# Patient Record
Sex: Female | Born: 2005 | Race: Black or African American | Hispanic: No | Marital: Single | Smoking: Never smoker
Health system: Southern US, Community
[De-identification: ages and names within clinical notes are randomized; demographics above are authoritative.]

## PROBLEM LIST (undated history)

## (undated) ENCOUNTER — Inpatient Hospital Stay (HOSPITAL_COMMUNITY): Payer: Self-pay

## (undated) DIAGNOSIS — Z789 Other specified health status: Secondary | ICD-10-CM

---
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
## 2023-08-18 ENCOUNTER — Encounter: Payer: Self-pay | Admitting: *Deleted

## 2023-08-24 NOTE — Progress Notes (Deleted)
    SUBJECTIVE:   CHIEF COMPLAINT / HPI: Testing  Discussed the use of AI scribe software for clinical note transcription with the patient, who gave verbal consent to proceed.  History of Present Illness    PERTINENT  PMH / PSH: ***  OBJECTIVE:   There were no vitals taken for this visit.  ***  ASSESSMENT/PLAN:   Assessment & Plan    Assessment and Plan Assessment & Plan    Genora Kidd, MD [facility]

## 2023-08-25 ENCOUNTER — Ambulatory Visit: Admitting: Student

## 2023-08-26 ENCOUNTER — Ambulatory Visit: Admitting: Student

## 2023-10-16 ENCOUNTER — Ambulatory Visit (INDEPENDENT_AMBULATORY_CARE_PROVIDER_SITE_OTHER): Admitting: Family Medicine

## 2023-10-16 ENCOUNTER — Other Ambulatory Visit (HOSPITAL_COMMUNITY)
Admission: RE | Admit: 2023-10-16 | Discharge: 2023-10-16 | Disposition: A | Discharge status: Home / Self Care | Source: Ambulatory Visit | Attending: Family Medicine | Admitting: Family Medicine

## 2023-10-16 VITALS — BP 121/73 | HR 76 | Ht 72.0 in | Wt 276.8 lb

## 2023-10-16 DIAGNOSIS — Z3200 Encounter for pregnancy test, result unknown: Secondary | ICD-10-CM | POA: Diagnosis not present

## 2023-10-16 DIAGNOSIS — N760 Acute vaginitis: Secondary | ICD-10-CM

## 2023-10-16 DIAGNOSIS — Z113 Encounter for screening for infections with a predominantly sexual mode of transmission: Secondary | ICD-10-CM

## 2023-10-16 DIAGNOSIS — Z3201 Encounter for pregnancy test, result positive: Secondary | ICD-10-CM

## 2023-10-16 DIAGNOSIS — N898 Other specified noninflammatory disorders of vagina: Secondary | ICD-10-CM

## 2023-10-16 DIAGNOSIS — B9689 Other specified bacterial agents as the cause of diseases classified elsewhere: Secondary | ICD-10-CM | POA: Diagnosis not present

## 2023-10-16 LAB — POCT WET PREP (WET MOUNT)
Clue Cells Wet Prep Whiff POC: POSITIVE
Trichomonas Wet Prep HPF POC: ABSENT

## 2023-10-16 LAB — POCT URINE PREGNANCY: Preg Test, Ur: POSITIVE — AB

## 2023-10-16 MED ORDER — METRONIDAZOLE 500 MG PO TABS: 500.0000 mg | ORAL_TABLET | Freq: Two times a day (BID) | ORAL | 0 refills | Status: DC

## 2023-10-16 NOTE — Assessment & Plan Note (Addendum)
 History of BV Wet mount, STD testing done today.  Patient declined HIV and RPR Discussed contraception, patient prefers to continue using barrier contraception at this time Pregnancy test POSITIVE Wet mount showed BV, Rx Flagyl  twice daily x 7 days Will f/u STD testing as appropriate

## 2023-10-16 NOTE — Addendum Note (Signed)
 Addended byBETHA COWARD, Coston Mandato on: 10/16/2023 11:49 AM   Modules accepted: Orders

## 2023-10-16 NOTE — Addendum Note (Signed)
 Addended by: Jessica Checketts L on: 10/16/2023 11:50 AM   Modules accepted: Orders

## 2023-10-16 NOTE — Progress Notes (Signed)
    SUBJECTIVE:   CHIEF COMPLAINT / HPI:   Here for STD and pregnancy testing Has been having some vaginal discharge over the past 5 days.  Describes it as milky white and smelly Denies any dysuria, abdominal pain, fevers, flank pain, vaginal bleeding Sexually active with 1 female partner, unsure if he has history of STDs She uses condoms for protection and is not interested in other forms of contraception at this time  LMP 7/1-7/4 Denies bleeding or spotting since her period  PERTINENT  PMH / PSH: History of BV  OBJECTIVE:   BP 121/73   Pulse 76   Ht 6' (1.829 m)   Wt 276 lb 12.8 oz (125.6 kg)   LMP 09/08/2023   SpO2 100%   BMI 37.54 kg/m    General: NAD, pleasant, able to participate in exam Respiratory: No respiratory distress Abd: Soft NTND Skin: warm and dry, no rashes noted Psych: Normal affect and mood  Pelvic exam: VULVA: normal appearing vulva with no masses, tenderness or lesions, VAGINA: vaginal discharge - white and creamy, CERVIX: normal appearing cervix without discharge or lesions, no discharge noted. Chaperoned by Thersia CMA   ASSESSMENT/PLAN:   Assessment & Plan BV (bacterial vaginosis) Vaginal discharge Routine screening for STI (sexually transmitted infection) Encounter for pregnancy test, result unknown History of BV Wet mount, STD testing done today.  Patient declined HIV and RPR Discussed contraception, patient prefers to continue using barrier contraception at this time Pregnancy test POSITIVE Wet mount showed BV, Rx Flagyl  twice daily x 7 days Will f/u STD testing as appropriate Positive pregnancy test Obtain initial OB screening labs today + hgb cascade [redacted]w[redacted]d per LMP Set up for initial OB visit Advised to start prenatal vitamin Pt feels excited about this result and wants to continue with the pregnancy   Payton Coward, MD [facility]

## 2023-10-16 NOTE — Patient Instructions (Addendum)
 Your pregnancy test was positive We are collecting some bloodwork today. We are scheduling you for your initial OB visit. Please start taking a prenatal vitamin (over the counter)  You are positive for BV.  Please take Flagyl  twice daily for 7 days  I will let give you a call if any of your results from today are abnormal or require treatment.  Otherwise I will send a message on MyChart

## 2023-10-19 LAB — CERVICOVAGINAL ANCILLARY ONLY
Chlamydia: POSITIVE — AB
Comment: NEGATIVE
Comment: NEGATIVE
Comment: NORMAL
Neisseria Gonorrhea: NEGATIVE
Trichomonas: NEGATIVE

## 2023-10-21 ENCOUNTER — Ambulatory Visit: Payer: Self-pay | Admitting: Family Medicine

## 2023-10-21 ENCOUNTER — Telehealth: Payer: Self-pay

## 2023-10-21 DIAGNOSIS — A749 Chlamydial infection, unspecified: Secondary | ICD-10-CM

## 2023-10-21 LAB — CBC/D/PLT+RPR+RH+ABO+RUBIGG...
Antibody Screen: NEGATIVE
Basophils Absolute: 0 x10E3/uL (ref 0.0–0.2)
Basos: 0 %
Bilirubin, UA: NEGATIVE
EOS (ABSOLUTE): 0 x10E3/uL (ref 0.0–0.4)
Eos: 0 %
Glucose, UA: NEGATIVE
HCV Ab: NONREACTIVE
HIV Screen 4th Generation wRfx: NONREACTIVE
Hematocrit: 36.5 % (ref 34.0–46.6)
Hemoglobin: 11.9 g/dL (ref 11.1–15.9)
Hepatitis B Surface Ag: NEGATIVE
Immature Grans (Abs): 0 x10E3/uL (ref 0.0–0.1)
Immature Granulocytes: 0 %
Ketones, UA: NEGATIVE
Lymphocytes Absolute: 2.4 x10E3/uL (ref 0.7–3.1)
Lymphs: 20 %
MCH: 29.2 pg (ref 26.6–33.0)
MCHC: 32.6 g/dL (ref 31.5–35.7)
MCV: 90 fL (ref 79–97)
Monocytes Absolute: 0.6 x10E3/uL (ref 0.1–0.9)
Monocytes: 5 %
Neutrophils Absolute: 8.6 x10E3/uL — ABNORMAL HIGH (ref 1.4–7.0)
Neutrophils: 75 %
Nitrite, UA: NEGATIVE
Platelets: 334 x10E3/uL (ref 150–450)
Protein,UA: NEGATIVE
RBC, UA: NEGATIVE
RBC: 4.08 x10E6/uL (ref 3.77–5.28)
RDW: 14.9 % (ref 11.7–15.4)
RPR Ser Ql: NONREACTIVE
Rh Factor: POSITIVE
Rubella Antibodies, IGG: 8.8 {index} (ref >0.99)
Specific Gravity, UA: 1.023 (ref 1.005–1.030)
Urobilinogen, Ur: 0.2 mg/dL (ref 0.2–1.0)
WBC: 11.6 x10E3/uL — ABNORMAL HIGH (ref 3.4–10.8)
pH, UA: 6 (ref 5.0–7.5)

## 2023-10-21 LAB — HCV INTERPRETATION

## 2023-10-21 LAB — MICROSCOPIC EXAMINATION
Bacteria, UA: NONE SEEN
Casts: NONE SEEN /LPF
RBC, Urine: NONE SEEN /HPF (ref 0–2)

## 2023-10-21 LAB — URINE CULTURE, OB REFLEX

## 2023-10-21 LAB — HGB FRACTIONATION CASCADE
Hgb A2: 2.6 (ref 1.8–3.2)
Hgb A: 97.4 (ref 96.4–98.8)
Hgb F: 0 % (ref 0.0–2.0)
Hgb S: 0 %

## 2023-10-21 MED ORDER — AZITHROMYCIN 500 MG PO TABS: 1000.0000 mg | ORAL_TABLET | Freq: Once | ORAL | 0 refills | Status: AC

## 2023-10-21 NOTE — Telephone Encounter (Signed)
 STI form place into to be faxed box. To be faxed off. Nelson Land, CMA

## 2023-10-21 NOTE — Telephone Encounter (Signed)
-----   Message from [facility] Lamar B sent at 10/20/2023 10:51 AM EDT ----- Regarding: STI reporting Pos chlamydia

## 2023-10-22 ENCOUNTER — Other Ambulatory Visit: Payer: Self-pay

## 2023-10-22 ENCOUNTER — Ambulatory Visit (INDEPENDENT_AMBULATORY_CARE_PROVIDER_SITE_OTHER)

## 2023-10-22 VITALS — BP 114/70 | HR 73 | Wt 274.0 lb

## 2023-10-22 DIAGNOSIS — O219 Vomiting of pregnancy, unspecified: Secondary | ICD-10-CM

## 2023-10-22 DIAGNOSIS — A749 Chlamydial infection, unspecified: Secondary | ICD-10-CM | POA: Diagnosis not present

## 2023-10-22 DIAGNOSIS — Z3401 Encounter for supervision of normal first pregnancy, first trimester: Secondary | ICD-10-CM | POA: Diagnosis not present

## 2023-10-22 MED ORDER — DOXYLAMINE-PYRIDOXINE 10-10 MG PO TBEC: 4.0000 | DELAYED_RELEASE_TABLET | Freq: Four times a day (QID) | ORAL | 2 refills | Status: DC | PRN

## 2023-10-22 MED ORDER — AZITHROMYCIN 500 MG PO TABS: 1000.0000 mg | ORAL_TABLET | Freq: Once | ORAL | Status: DC

## 2023-10-22 MED ORDER — DOXYLAMINE-PYRIDOXINE 10-10 MG PO TBEC: 1.0000 | DELAYED_RELEASE_TABLET | Freq: Four times a day (QID) | ORAL | 2 refills | Status: AC | PRN

## 2023-10-22 NOTE — Patient Instructions (Signed)
 Pregnancy Related Return Precautions The follow are signs/symptoms that are abnormal in pregnancy and may require further evaluation at the MAU in the [facility]: You have cramping/contractions that do not go away with drinking water, especially if they are lasting 30 seconds to 1.5 minutes, coming and going every 5-10 minutes for an hour or more, or are getting stronger and you cannot walk or talk while having a contraction/cramp. Your water breaks.  Sometimes it is a big gush of fluid, sometimes it is just a trickle that keeps getting your underwear wet or running down your legs You have vaginal bleeding.    You are beyond 22 weeks and do not feel your baby moving like normal.  If you do not, get something to eat and drink (something cold or something with sugar like peanut butter or juice) and lay down and focus on feeling your baby move. If your baby is still not moving like normal, you should go to MAU. You should feel your baby move 6 times in one hour, or 10 times in two hours. You have a persistent headache that does not go away with 1000mg  of Tylenol , is accompanied by vision changes, chest pain, difficulty breathing, severe pain in your right upper abdomen, or worsening leg swelling- these can all be signs of high blood pressure in pregnancy and need to be evaluated by a provider immediately  For any pregnancy-related emergencies, please go to the Maternity Admissions Unit in the [facility] at [facility]. You will use hospital Entrance C.

## 2023-10-22 NOTE — Progress Notes (Signed)
 Patient Name: Dawn Bell Date of Birth: January 31, 2006 [facility] Initial Prenatal Visit  Dawn Bell is a 18 y.o. year old G1P0 at [redacted]w[redacted]d who presents for her initial prenatal visit. Pregnancy is not planned She reports morning sickness and nausea. Nauseous 3-4x/week, no vomiting. She is taking a prenatal vitamin.  She denies pelvic pain or vaginal bleeding.   Pregnancy Dating: The patient is dated by LMP.  LMP: 09/08/2023 Period is certain:  Yes.  Periods were regular:  Yes.  LMP was a typical period:  Yes.  Using hormonal contraception in 3 months prior to conception: No  Lab Review: Blood type: O Rh Status: + Antibody screen: Not done HIV: Negative RPR: Negative Hemoglobin electrophoresis reviewed: Yes Results of OB urine culture are: Negative Rubella: Immune Hep C Ab: Negative Varicella status is Immune  PMH: Reviewed and as detailed below: HTN: No  Gestational Hypertension/preeclampsia: No  Type 1 or 2 Diabetes: No  Depression:  Yes, not medicated or diagnosed but resolved about 1 year ago Seizure disorder:  No VTE: No ,  History of STI Yes, chlamydia will do test for cure in 4 weeks. Abnormal Pap smear:  No, Genital herpes simplex:  No   PSH: Gynecologic Surgery:  no Surgical history reviewed, notable for: none  Obstetric History: Obstetric history tab updated and reviewed.  Summary of prior pregnancies: none Cesarean delivery: No  Gestational Diabetes:  No Hypertension in pregnancy: No History of preterm birth: No History of LGA/SGA infant:  No History of shoulder dystocia: No Indications for referral were reviewed, and the patient has no obstetric indications for referral to High Risk OB Clinic at this time.   Social History: Partner's name: Jayden murl)  Tobacco use: No Alcohol use:  No Other substance use:  No  Financial Assistance:  Adopt A Mom Patient?: No  If AAM, have they contacted Eric to enroll in [employer]?: No  Current Medications:  PNV  Reviewed and appropriate in pregnancy.   Genetic and Infection Screen: Flow Sheet Updated Yes  Prenatal Exam: Gen: Well nourished, well developed.  No distress.  Vitals noted. HEENT: Normocephalic, atraumatic.  Neck supple without cervical lymphadenopathy, thyromegaly or thyroid nodules.  Fair dentition. CV: RRR no murmur, gallops or rubs Lungs: CTA B.  Normal respiratory effort without wheezes or rales. Abd: soft, NTND. +BS.  Uterus not appreciated above pelvis. GU: Normal external female genitalia without lesions.  Nl vaginal, well rugated without lesions. No vaginal discharge.  Bimanual exam: No adnexal mass or TTP. No CMT.  Uterus size normal Ext: No clubbing, cyanosis or edema. Psych: Normal grooming and dress.  Not depressed or anxious appearing.  Normal thought content and process without flight of ideas or looseness of associations  Fetal heart tones: too early*  Assessment/Plan:  Dawn Bell is a 18 y.o. G1P0 at [redacted]w[redacted]d who presents to initiate prenatal care. She is doing well.  Current pregnancy issues include feeling hot and some nausea.  Routine prenatal care: As dating is reliable, a dating ultrasound has not been ordered. Dating tab updated. Pre-pregnancy weight updated. Expected weight gain this pregnancy is 11-20 pounds  Prenatal labs reviewed, notable for chlamydia. Indications for referral to [facility] were reviewed and the patient does not meet criteria for referral.  Medication list reviewed and updated.  Recommended patient see a dentist for regular care.  Bleeding and pain precautions reviewed. Importance of prenatal vitamins reviewed.  Genetic screening offered. Patient opted for: patient undecided,  will address at future visit. The patient has the following indications for aspirinto begin 81 mg at 12-16 weeks: One high risk condition: no single high risk condition  MORE than one moderate risk condition: nulliparity,  obesity, and identifies as African American  Aspirin was  recommended today based upon above risk factors (one high risk condition or more than one moderate risk factor)  The patient will not be age 57 or over at time of delivery. Referral to genetic counseling was offered today.  The patient has the following risk factors for preexisting diabetes: BMI >25 and history of prediabetes . An early 1 hour glucose tolerance test was ordered for next visit because patient had to leave. Pregnancy Medical Home and PHQ-9 forms completed, problems noted: Yes  2. Pregnancy issues include the following which were addressed today:  Nausea, no vomiting yet. Prescribed diclegis  if worsens. Feeling hot, likely related to hormones. Encouraged to use cool rags and fans to help. Strict precautions given for going to the MAU.   Follow up 4 weeks for next prenatal visit.

## 2023-10-23 DIAGNOSIS — Z3401 Encounter for supervision of normal first pregnancy, first trimester: Secondary | ICD-10-CM | POA: Insufficient documentation

## 2023-10-30 ENCOUNTER — Encounter

## 2023-10-30 ENCOUNTER — Encounter: Payer: Self-pay | Admitting: Family Medicine

## 2023-10-30 DIAGNOSIS — A568 Sexually transmitted chlamydial infection of other sites: Secondary | ICD-10-CM | POA: Insufficient documentation

## 2023-11-04 ENCOUNTER — Inpatient Hospital Stay (HOSPITAL_COMMUNITY)
Admission: AD | Admit: 2023-11-04 | Discharge: 2023-11-04 | Disposition: A | Discharge status: Home / Self Care | Attending: Obstetrics and Gynecology | Admitting: Obstetrics and Gynecology

## 2023-11-04 ENCOUNTER — Other Ambulatory Visit: Payer: Self-pay

## 2023-11-04 ENCOUNTER — Inpatient Hospital Stay (HOSPITAL_COMMUNITY)

## 2023-11-04 DIAGNOSIS — R109 Unspecified abdominal pain: Secondary | ICD-10-CM | POA: Diagnosis not present

## 2023-11-04 DIAGNOSIS — O418X10 Other specified disorders of amniotic fluid and membranes, first trimester, not applicable or unspecified: Secondary | ICD-10-CM | POA: Diagnosis not present

## 2023-11-04 DIAGNOSIS — O26891 Other specified pregnancy related conditions, first trimester: Secondary | ICD-10-CM | POA: Diagnosis not present

## 2023-11-04 DIAGNOSIS — O208 Other hemorrhage in early pregnancy: Secondary | ICD-10-CM | POA: Insufficient documentation

## 2023-11-04 DIAGNOSIS — Z3A01 Less than 8 weeks gestation of pregnancy: Secondary | ICD-10-CM | POA: Diagnosis not present

## 2023-11-04 DIAGNOSIS — B9689 Other specified bacterial agents as the cause of diseases classified elsewhere: Secondary | ICD-10-CM | POA: Diagnosis not present

## 2023-11-04 DIAGNOSIS — O23591 Infection of other part of genital tract in pregnancy, first trimester: Secondary | ICD-10-CM | POA: Diagnosis not present

## 2023-11-04 DIAGNOSIS — O468X1 Other antepartum hemorrhage, first trimester: Secondary | ICD-10-CM

## 2023-11-04 DIAGNOSIS — N760 Acute vaginitis: Secondary | ICD-10-CM | POA: Diagnosis not present

## 2023-11-04 DIAGNOSIS — Z3491 Encounter for supervision of normal pregnancy, unspecified, first trimester: Secondary | ICD-10-CM

## 2023-11-04 DIAGNOSIS — Z3A08 8 weeks gestation of pregnancy: Secondary | ICD-10-CM | POA: Diagnosis not present

## 2023-11-04 LAB — CBC
HCT: 34.6 % — ABNORMAL LOW (ref 36.0–46.0)
Hemoglobin: 11.5 g/dL — ABNORMAL LOW (ref 12.0–15.0)
MCH: 29.4 pg (ref 26.0–34.0)
MCHC: 33.2 g/dL (ref 30.0–36.0)
MCV: 88.5 fL (ref 80.0–100.0)
Platelets: 321 K/uL (ref 150–400)
RBC: 3.91 MIL/uL (ref 3.87–5.11)
RDW: 14.9 % (ref 11.5–15.5)
WBC: 9.8 K/uL (ref 4.0–10.5)
nRBC: 0 % (ref 0.0–0.2)

## 2023-11-04 LAB — URINALYSIS, ROUTINE W REFLEX MICROSCOPIC
Bilirubin Urine: NEGATIVE
Glucose, UA: NEGATIVE mg/dL
Hgb urine dipstick: NEGATIVE
Ketones, ur: NEGATIVE mg/dL
Leukocytes,Ua: NEGATIVE
Nitrite: NEGATIVE
Protein, ur: NEGATIVE mg/dL
Specific Gravity, Urine: 1.024 (ref 1.005–1.030)
pH: 6 (ref 5.0–8.0)

## 2023-11-04 LAB — WET PREP, GENITAL
Sperm: NONE SEEN
Trich, Wet Prep: NONE SEEN
WBC, Wet Prep HPF POC: <10 (ref <10)
Yeast Wet Prep HPF POC: NONE SEEN

## 2023-11-04 LAB — HCG, QUANTITATIVE, PREGNANCY: hCG, Beta Chain, Quant, S: 47239 m[IU]/mL — ABNORMAL HIGH (ref <5)

## 2023-11-04 MED ORDER — METRONIDAZOLE 500 MG PO TABS: 500.0000 mg | ORAL_TABLET | Freq: Two times a day (BID) | ORAL | 0 refills | Status: DC

## 2023-11-04 NOTE — MAU Provider Note (Signed)
 S [name] is a 18 y.o. G1P0 patient who presents to MAU today with complaint of abdominal pain with menstrual-like cramping and also reports she has been having a thick white chunky discharge for the past 5 days.  She now reports that she is having some pinkish discharge with the vaginal discharge.  She may had made an appointment at her office but overslept and there for is here for evaluation.  Patient's pregnancy is complicated by history of chlamydia in pregnancy on 10/16/2023 which has been treated.  Patient reports compliance with treatment as well as her partner.  Her pregnancy is also complicated by obesity and she has been prescribed daily aspirin which she reports compliance with.  Review of Systems  Constitutional:  Negative for chills and fever.  Cardiovascular:  Negative for chest pain.  Gastrointestinal:  Positive for abdominal pain.  Genitourinary:  Negative for dysuria.       Vaginal discharge   All other systems reviewed and are negative.    O BP (!) 130/57 (BP Location: Right Arm)   Pulse 78   Temp 98.4 F (36.9 C) (Oral)   Resp 18   Ht 6' (1.829 m)   Wt 123.3 kg   LMP 09/08/2023 (Approximate)   SpO2 99%   BMI 36.86 kg/m  Physical Exam Vitals and nursing note reviewed.  Constitutional:      General: She is not in acute distress.    Appearance: She is well-developed. She is obese. She is not ill-appearing.  HENT:     Head: Normocephalic.  Cardiovascular:     Rate and Rhythm: Normal rate and regular rhythm.  Pulmonary:     Effort: Pulmonary effort is normal.  Abdominal:     Palpations: Abdomen is soft.     Tenderness: There is no abdominal tenderness.  Musculoskeletal:        General: Normal range of motion.     Cervical back: Normal range of motion.  Skin:    General: Skin is warm.  Neurological:     Mental Status: She is alert and oriented to person, place, and time.  Psychiatric:        Mood and Affect: Mood normal.        Behavior:  Behavior normal.     MDM  HIGH  Vaginal bleeding/cramping  in early pregnacy CBC: pending at discharge  HCG Quant: pending at discharge ABO: O Positive  OB Ultrasound (findings consistent with an IUP, gestational sac, fetal pole, yolk sac visualized with cardiac activity at 118 bpm.  There was also a small subchorionic hemorrhage noted) Vaginal Swabs: Wet prep c/w BV, GC pending at discharge ( RX for Flagyl  sent at discharge) UA: unremarkable   Differential diagnosis considered for 1st trimester vaginal bleeding includes but is not limited to: ectopic pregnancy, complete spontaneous abortion, incomplete abortion, missed abortion, threatened abortion, embryonic/fetal demise, cervical insufficiency, cervical or vaginal disorder    Orders Placed This Encounter  Procedures   Wet prep, genital    Standing Status:   Standing    Number of Occurrences:   1   US  OB LESS THAN 14 WEEKS WITH OB TRANSVAGINAL    Standing Status:   Standing    Number of Occurrences:   1    Symptom/Reason for Exam:   Vaginal spotting [[id number]]   Urinalysis, Routine w reflex microscopic -Urine, Clean Catch    Standing Status:   Standing    Number of Occurrences:   1  Specimen Source:   Urine, Clean Catch [[id number]]   CBC    Standing Status:   Standing    Number of Occurrences:   1   hCG, quantitative, pregnancy    Standing Status:   Standing    Number of Occurrences:   1      No results found for this or any previous visit (from the past 24 hours).     I have reviewed the patient chart and performed the physical exam . I have ordered & interpreted the lab results and reviewed and interpreted the ultrasound images Medications ordered as stated below.  A/P as described below.  Counseling and education provided and patient agreeable  with plan as described below. Verbalized understanding.    ASSESSMENT Medical screening exam complete Normal intrauterine pregnancy on prenatal ultrasound in first  trimester  Subchorionic hematoma in first trimester, single or unspecified fetus  Bacterial vaginosis    PLAN Future Appointments  Date Time Provider Department Center  11/23/2023  3:35 PM Adele Song, MD [facility]     Allergies as of 11/04/2023   No Known Allergies      Medication List     TAKE these medications    Doxylamine -Pyridoxine  10-10 MG Tbec Commonly known as: Diclegis  Take 1 tablet by mouth 4 (four) times daily as needed ((2 tablets at night, one in the morning if neede, can take one more at lunch if needed).   metroNIDAZOLE  500 MG tablet Commonly known as: FLAGYL  Take 1 tablet (500 mg total) by mouth 2 (two) times daily.   prenatal multivitamin Tabs tablet Take 1 tablet by mouth daily at 12 noon.        Discharge from MAU in stable condition  See AVS for full description of educational information and instructions provided to the patient at time of discharge   Warning signs for worsening condition that would warrant emergency follow-up discussed Patient may return to MAU as needed   Littie Olam LABOR, NP 11/04/2023 3:33 PM

## 2023-11-04 NOTE — MAU Note (Signed)
 Dawn Bell is a 18 y.o. at [redacted]w[redacted]d here in MAU reporting: abdominal pain that comes and goes. Mix of feeling like menstrual cramps and sharp pains. Has had some diarrhea today. Also reporting light pink discharge that has changed. Originally noted white, chunky discharge 5 days ago. Made an appointment but overslept and did not keep it.    Onset of complaint: about a week Pain score: 7/10 Vitals:   11/04/23 1516  BP: (!) 130/57  Pulse: 78  Resp: 18  Temp: 98.4 F (36.9 C)  SpO2: 99%      Lab orders placed from triage: UA, swabs

## 2023-11-05 LAB — GC/CHLAMYDIA PROBE AMP (~~LOC~~) NOT AT ARMC
Chlamydia: NEGATIVE
Comment: NEGATIVE
Comment: NORMAL
Neisseria Gonorrhea: NEGATIVE

## 2023-11-23 ENCOUNTER — Ambulatory Visit (INDEPENDENT_AMBULATORY_CARE_PROVIDER_SITE_OTHER): Payer: Self-pay | Admitting: Family Medicine

## 2023-11-23 VITALS — BP 122/66 | HR 82 | Wt 268.0 lb

## 2023-11-23 DIAGNOSIS — Z23 Encounter for immunization: Secondary | ICD-10-CM

## 2023-11-23 DIAGNOSIS — B9689 Other specified bacterial agents as the cause of diseases classified elsewhere: Secondary | ICD-10-CM | POA: Diagnosis not present

## 2023-11-23 DIAGNOSIS — Z131 Encounter for screening for diabetes mellitus: Secondary | ICD-10-CM

## 2023-11-23 DIAGNOSIS — N760 Acute vaginitis: Secondary | ICD-10-CM

## 2023-11-23 DIAGNOSIS — Z3401 Encounter for supervision of normal first pregnancy, first trimester: Secondary | ICD-10-CM

## 2023-11-23 LAB — POCT WET PREP (WET MOUNT)
Clue Cells Wet Prep Whiff POC: NEGATIVE
Trichomonas Wet Prep HPF POC: ABSENT

## 2023-11-23 NOTE — Progress Notes (Signed)
  Patient Name: Dawn Bell Date of Birth: August 14, 2005 [facility] Prenatal Visit  Dawn Bell is a 18 y.o. G1P0 at [redacted]w[redacted]d here for routine follow up. She is dated by LMP.  She reports no bleeding, no cramping, no leaking, and vaginal irritation.  She denies vaginal bleeding.  See flow sheet for details.  Vitals:   11/23/23 1614 11/23/23 1624  BP: 132/61 122/66  Pulse: 82      A/P: Pregnancy at [redacted]w[redacted]d.  Doing well.    Routine Prenatal Care:  Dating reviewed, dating tab is correct Fetal heart tones Not detected and discussed with preceptor, ultrasound obtained .  Cardiac activity seen on ultrasound Influenza vaccine administered today.   COVID vaccination was discussed and not available at clinic today, please discuss at next appointment.  The patient has the following indication for screening preexisting diabetes: BMI > 25 and high risk ethnicity (Latino, [place] American, Native American, [place] Islander, Asian Naval architect) .  Ordered future point-of-care A1c given [facility] clinic closed at time of appointment Anatomy ultrasound ordered to be scheduled at 18-20 weeks. Patient is interested in genetic screening. As she is not past 13 weeks and 6 days, a cell free DNA was offered.  Future order placed for this as phlebotomist not in house today Pregnancy education including expected weight gain in pregnancy, OTC medication use, continued use of prenatal vitamin, smoking cessation if applicable, and nutrition in pregnancy.   Bleeding and pain precautions reviewed. The patient has the following indications for aspirin to begin 81 mg at 12-16 weeks: One high risk condition: no single high risk condition  MORE than one moderate risk condition: nulliparity, obesity, and identifies as African American  Aspirin was  recommended today based upon above risk factors (one high risk condition or more than one moderate risk factor).   2. Pregnancy issues include the following and were  addressed as appropriate today:  Some light cramping, concern for reinfection with BV.  Wet prep completed today Problem list  and pregnancy box updated: Yes.    Follow up 4 weeks.

## 2023-11-23 NOTE — Patient Instructions (Addendum)
 Thank you for coming in today! Here is a summary of what we discussed:  -Please call in the morning and schedule a lab appointment to have your A1c done.  You can also have the cell free DNA done at that time or at your next appointment.  -Please schedule your next OB appointment for 4 weeks from now  We are checking some labs today. If they are abnormal, I will call you. If they are normal, I will send you a MyChart message (if it is active) or a letter in the mail. If you do not hear about your labs in the next 2 weeks, please call the office.   Please call the clinic at [phone] if your symptoms worsen or you have any concerns.  Best, Dr Adele

## 2023-11-24 ENCOUNTER — Ambulatory Visit: Payer: Self-pay | Admitting: Family Medicine

## 2023-11-26 ENCOUNTER — Encounter: Payer: Self-pay | Admitting: Family Medicine

## 2023-11-26 DIAGNOSIS — O09899 Supervision of other high risk pregnancies, unspecified trimester: Secondary | ICD-10-CM | POA: Insufficient documentation

## 2023-12-08 ENCOUNTER — Telehealth: Payer: Self-pay

## 2023-12-09 NOTE — Telephone Encounter (Cosign Needed Addendum)
 UPDATE 9/30  Notes: Reached out to pt via telephone call re: initial ob visit to offer consent to [employer]. Discussed how [employer] can provide connection to resources in the area that mom may not be aware of as well as offer support throughout the longevity of the pregnancy. Guided conversation assessing non-medical needs in the household was scheduled for 10/9.   UPDATE 9/17  Notes: Reached out to pt via telephone call re: initial ob visit on 8/14 to offer consent to [employer]. Unable to reach- left voicemail giving short explanation of [employer] and requesting call back.   UPDATE 9/15  Notes: Reached out to pt via telephone call re: initial ob visit on 8/14 to offer consent to [employer]. Unable to reach- sent text requesting call back.   UPDATE 9/8  Notes: Reached out to pt via telephone call re: initial ob visit on 8/14 to offer consent to [employer]. Unable to reach- left voicemail giving short explanation of [employer] and requesting call back.   Dawn Bell Waddell Community Navigator [facility] [employer] [facility] [place] Direct Dial: [phone]

## 2023-12-21 DIAGNOSIS — R519 Headache, unspecified: Secondary | ICD-10-CM | POA: Diagnosis not present

## 2023-12-21 DIAGNOSIS — R030 Elevated blood-pressure reading, without diagnosis of hypertension: Secondary | ICD-10-CM | POA: Diagnosis not present

## 2023-12-21 DIAGNOSIS — O26891 Other specified pregnancy related conditions, first trimester: Secondary | ICD-10-CM | POA: Diagnosis not present

## 2023-12-21 DIAGNOSIS — O161 Unspecified maternal hypertension, first trimester: Secondary | ICD-10-CM | POA: Diagnosis not present

## 2023-12-21 DIAGNOSIS — Z3A13 13 weeks gestation of pregnancy: Secondary | ICD-10-CM | POA: Diagnosis not present

## 2023-12-21 NOTE — ED Provider Notes (Signed)
 ------------------------------------------------------------------------------- Attestation signed by Manuelita Lorane Moats, DO at 12/22/2023 12:27 AM I saw and evaluated the patient. Discussed with Dr. Morrie, resident, and agree with their findings and plan and documented in the their note.   Patient's presentation is most consistent with acute presentation with potential threat to life or bodily function.  In summary this is a 18 year old female G1, P0 at [redacted] weeks pregnant presenting with a chief complaint of headache.  Patient notes new onset headaches 2 weeks ago during her pregnancy.  She has not had a history of headaches previously.  No falls or injuries.  She has also been hypertensive throughout the pregnancy.  She notes she has been following with OB and they have told her they do not start her on any medications yet.  She notes that she is changing obstetricians and will plan to talk with a new obstetrician about the hypertension.  Discussed with the patient obtaining CT head imaging since she has no history of headaches.  Concern for acute intracranial abnormality such as mass, venous sinus thrombosis, other acute intracranial abnormality.  Although my concern for these is pretty low did discuss with the patient that the best choice would be CT imaging of the head.  Discussed with the patient that we could defer at this time and treat with medications if she had adequate improvement in her headache.  She would prefer to avoid radiation at this time and proceed with medication management.  The medication management did significantly improve her headache.  Discussed extensively return precautions if her headaches persist or worsen.  Further discussed if she has any other new symptoms associated with this she should return immediately for further evaluation.  Patient acknowledged agreement understanding with this plan.  All questions were answered.  Patient is discharged.   Manuelita DELENA Moats,  DO Emergency Medicine  -------------------------------------------------------------------------------  [facility] Emergency Department Provider Note   Chief Complaint: Headache    History of Present Illness  History of Present Illness Dawn Bell is a 18 y.o. female currently [redacted] weeks pregnant who presents to the emergency department for evaluation of a headache.  She has been experiencing intermittent nighttime headaches for the past 2 weeks, which typically start as she prepares for bed. The onset of the headache is often preceded by dizziness and a throbbing sensation in her temples. Accompanying symptoms include occasional blurry vision and sensitivity to bright light, which exacerbates the headache. Loud sounds do not seem to worsen her condition. She reports no nausea or vomiting associated with the headaches. The headaches, which can last up to 8 hours, are sometimes severe enough to disrupt her sleep. She has attempted to manage the pain with Tylenol, taking two tablets at a time, but this has been minimally effective. Despite attempts to identify potential food triggers, she has not found any correlation between her diet and the onset of the headaches. She maintains good hydration, consuming at least eight bottles of water daily. She also reports numbness and tingling in her hands and feet, which can occur before or during a headache episode. She rates her current headache pain as 6.5 out of 10. She has not experienced similar headaches prior to her pregnancy.  She is currently [redacted] weeks pregnant and is under the care of an obstetrician.  She notes however that she is scheduled to establish with a different OB/GYN next week and that she chose to switch physicians after her prior OB/GYN was not concerned about her elevated  blood pressure readings during her first trimester.  Otherwise, she has not had any abnormalities or concerns noted by her obstetrician.  She  notes that her urine output has decreased since reaching 12 weeks of pregnancy. She also experiences pain on the left side of her face when chewing or swallowing during a headache episode, but this pain subsides when the headache resolves.  Per chart review, the patient was seen in the [facility] ED on 11/04/2023 early in her first trimester at which time her initial blood pressure was noted to be 130/57.  OB ultrasound at that time showed findings consistent with IUP with positive cardiac activity as well as a small subchorionic hemorrhage.   ED Course & Medical Decision Making  Initial Vital Signs for the past 24 hrs (Last 1 readings):  BP Temp Temp src Pulse Resp SpO2 Height Weight  12/21/23 0247 145/76 98.2 F (36.8 C) Oral 74 18 98 % 182.9 cm (6') 126 kg (278 lb)    Patient with the above history is presenting with intermittent headache in pregnancy (13 weeks) with subjective paresthesia on the left side of her face and arm.  Differential Diagnoses include (but are not limited to): Migraine headache, tension headache, cluster headache, maternal hypertension, dural venous sinus thrombosis.  As the patient is less than [redacted] weeks gestation, preeclampsia is not considered.  Plan: Will obtain urinalysis.  After shared decision making conversation, will defer CT venogram for evaluation of dural venous sinus thrombosis.  Will treat her headache with oral Benadryl, magnesium, metoclopramide and will reassess.  If this does not help her headache we will revisit the topic of CT venogram imaging.   ED Course as of 12/21/23 0541  Mon Dec 21, 2023  0346 BP: 145/76 [SK]    ED Course User Index [SK] Jayson Fairy Croft, DO    Laboratory work-up significant for: Urinalysis not indicative of infection or proteinuria.  Patient re-evaluated, her headache having improved to a 4 out of 10 in severity after migraine cocktail. Vital signs are stable. Patient is stable for discharge at this time provided close  follow-up with her OB/GYN.  The patient reports that she has an appointment with a new OB/GYN on 12/30/2023 and we discussed at length the importance of bringing up her persistently elevated blood pressure readings at this visit.  The patient tells us  that concern for her blood pressure is the primary reason that she has chosen to reestablish care with this new physician.  We also discussed at length strict return precautions including acute changes in the patient's neurologic status or severe worsening in her headache.  We further discussed that the patient is at elevated risk of cavernous sinus thrombosis while she is pregnant and that this is a very serious diagnosis.  However, after lengthy shared decision-making conversation, will defer further imaging studies tonight after patient's headache responded to oral migraine cocktail.  The patient verbalized understanding of these concerning signs and symptoms to watch for and agrees with the plan of care.  All questions and concerns were solicited and answered.   Disposition: Due to the patient's current presenting symptoms, physical exam findings, and the workup stated above, the most likely diagnosis is elevated blood pressure in first trimester pregnancy, headache  DISCHARGE: Patient is felt to be medically appropriate for discharge at this time. Patient was informed of all pertinent physical exam, laboratory, and imaging findings. Patient's suspected etiology of their symptom presentation was discussed with the patient and all questions were answered. Patient  was instructed to follow up closely with their primary care doctor for re-evaluation. Patient was given strict return precautions.   Physical Exam  Physical Exam General: Awake, alert, no acute distress.  Sitting comfortably in ED stretcher CV: Regular rate and rhythm, no murmurs rubs or gallops Lungs: CTA bilaterally.  No wheezes rales or rhonchi Abdomen: Soft, nondistended,  nontender Extremities: No calf tenderness or pretibial edema Neuro: A and O x 3.  Extraocular movements intact.  Patient reports decreased/altered sensation to the left side of her face and her left arm.  Otherwise, cranial nerves II through XII intact.  Answering questions appropriately.  Strength 5 out of 5 intact in the bilateral upper and lower extremities.    Dispo Summary  The patient was seen, evaluated, and treated in conjunction with the attending physician Dr. Nicholaus who voiced agreement in the care provided.  Diagnosis:  1. Elevated blood pressure affecting pregnancy in first trimester, antepartum (CMD)      2. Pregnancy headache in first trimester (CMD)       Follow up: With OB/GYN on 8/22 Disposition: Discharge   Past Contributory History   Allergies: Patient has no known allergies.   Personal History: Medical History[1]  Surgical History[2]   Results  Labs: Abnormal Labs Reviewed - No abnormal labs to display   Imaging: No orders to display         Electronically signed by: Jayson Fairy Croft, DO 12/21/2023 6:24 AM           [1] No past medical history on file. [2] No past surgical history on file.

## 2023-12-24 NOTE — Telephone Encounter (Signed)
 UPDATE 10/16 Notes: Reached out to pt via telephone call to complete Guided Conversation as scheduled. Connected with client but phone call disconnected 3 mins into call. Phone going straight to voicemail when calling back- left vm.  Bogdan Vivona Waddell [facility] [employer] [place] Direct Dial: [phone]   10/9 Notes: Reached out to pt via telephone call to complete Guided Conversation as scheduled. Unable to reach - left generic voicemail req call back or text with another date/time that works for her.  Adric Wrede Waddell [employer] [facility] [employer] [place] Direct Dial: [phone]

## 2024-01-05 ENCOUNTER — Inpatient Hospital Stay (HOSPITAL_COMMUNITY)
Admission: AD | Admit: 2024-01-05 | Discharge: 2024-01-05 | Disposition: A | Discharge status: Home / Self Care | Attending: Obstetrics and Gynecology | Admitting: Obstetrics and Gynecology

## 2024-01-05 ENCOUNTER — Other Ambulatory Visit: Payer: Self-pay

## 2024-01-05 DIAGNOSIS — O10912 Unspecified pre-existing hypertension complicating pregnancy, second trimester: Secondary | ICD-10-CM | POA: Diagnosis not present

## 2024-01-05 DIAGNOSIS — Z3A17 17 weeks gestation of pregnancy: Secondary | ICD-10-CM | POA: Diagnosis not present

## 2024-01-05 DIAGNOSIS — R519 Headache, unspecified: Secondary | ICD-10-CM | POA: Insufficient documentation

## 2024-01-05 DIAGNOSIS — O26892 Other specified pregnancy related conditions, second trimester: Secondary | ICD-10-CM | POA: Insufficient documentation

## 2024-01-05 DIAGNOSIS — I10 Essential (primary) hypertension: Secondary | ICD-10-CM

## 2024-01-05 LAB — COMPREHENSIVE METABOLIC PANEL WITH GFR
ALT: 30 U/L (ref 0–44)
AST: 24 U/L (ref 15–41)
Albumin: 3.1 g/dL — ABNORMAL LOW (ref 3.5–5.0)
Alkaline Phosphatase: 55 U/L (ref 38–126)
Anion gap: 10 (ref 5–15)
BUN: <5 mg/dL — ABNORMAL LOW (ref 6–20)
CO2: 21 mmol/L — ABNORMAL LOW (ref 22–32)
Calcium: 8.8 mg/dL — ABNORMAL LOW (ref 8.9–10.3)
Chloride: 103 mmol/L (ref 98–111)
Creatinine, Ser: 0.54 mg/dL (ref 0.44–1.00)
GFR, Estimated: >60 mL/min (ref >60)
Glucose, Bld: 92 mg/dL (ref 70–99)
Potassium: 3.7 mmol/L (ref 3.5–5.1)
Sodium: 134 mmol/L — ABNORMAL LOW (ref 135–145)
Total Bilirubin: 0.4 mg/dL (ref 0.0–1.2)
Total Protein: 6.9 g/dL (ref 6.5–8.1)

## 2024-01-05 LAB — CBC
HCT: 33.6 % — ABNORMAL LOW (ref 36.0–46.0)
Hemoglobin: 11.3 g/dL — ABNORMAL LOW (ref 12.0–15.0)
MCH: 29.6 pg (ref 26.0–34.0)
MCHC: 33.6 g/dL (ref 30.0–36.0)
MCV: 88 fL (ref 80.0–100.0)
Platelets: 285 K/uL (ref 150–400)
RBC: 3.82 MIL/uL — ABNORMAL LOW (ref 3.87–5.11)
RDW: 14 % (ref 11.5–15.5)
WBC: 10.2 K/uL (ref 4.0–10.5)
nRBC: 0 % (ref 0.0–0.2)

## 2024-01-05 MED ORDER — CYCLOBENZAPRINE HCL 10 MG PO TABS: 10.0000 mg | ORAL_TABLET | Freq: Two times a day (BID) | ORAL | 1 refills | Status: AC | PRN

## 2024-01-05 MED ORDER — CYCLOBENZAPRINE HCL 5 MG PO TABS
10.0000 mg | ORAL_TABLET | Freq: Once | ORAL | Status: DC
  Filled 2024-01-05: qty 2

## 2024-01-05 MED ORDER — ACETAMINOPHEN-CAFFEINE 500-65 MG PO TABS
2.0000 | ORAL_TABLET | Freq: Once | ORAL | Status: AC
  Administered 2024-01-05: 2 via ORAL
  Filled 2024-01-05: qty 2

## 2024-01-05 NOTE — MAU Note (Signed)
 Dawn Bell is a 18 y.o. at [redacted]w[redacted]d here in MAU reporting: HA that started 1400 (tylenol taken with no relief), blurred vision when moving, leg swelling worse last night then normal. Denies VB, LOF, abd pain, or UTI symptoms.   LMP: - Onset of complaint: 1400 Pain score: 7/10 Vitals:   01/05/24 1718  BP: (!) 111/58  Pulse: 86  Resp: 16  Temp: 98.7 F (37.1 C)  SpO2: 100%     FHT: 157  Lab orders placed from triage: ua  Pt reports hx HBP, no meds

## 2024-01-05 NOTE — MAU Provider Note (Signed)
 Chief Complaint:  Headache, Blurred Vision, and Leg Swelling   HPI   None     Dawn Bell is a 18 y.o. G1P0 at [redacted]w[redacted]d who presents to maternity admissions reporting headache. Patient reports headaches started at around 11 weeks of pregnancy. She gets headaches about 4-5 times each week and describes them as throbbing frontal and occipital headaches. She reports the only triggers she has identified are foods like syrup, bananas, and pickles. She takes Excedrin Tension with some relief but no resolution. Denies paresthesias, changes in vision, weakness, thunderclap headache.  Pregnancy Course: Will be moving care to [facility]. Patient reports BP has been >140 in the ED and at home several times, meeting diagnostic criteria for chronic hypertension.  No past medical history on file. OB History  Gravida Para Term Preterm AB Living  1       SAB IAB Ectopic Multiple Live Births          # Outcome Date GA Lbr Len/2nd Weight Sex Type Anes PTL Lv  1 Current            No past surgical history on file. No family history on file. Social History   Tobacco Use   Smoking status: Never   Smokeless tobacco: Never  Vaping Use   Vaping status: Never Used  Substance Use Topics   Alcohol use: Never   Drug use: Never   No Known Allergies Medications Prior to Admission  Medication Sig Dispense Refill Last Dose/Taking   Doxylamine -Pyridoxine  (DICLEGIS ) 10-10 MG TBEC Take 1 tablet by mouth 4 (four) times daily as needed ((2 tablets at night, one in the morning if neede, can take one more at lunch if needed). 60 tablet 2    metroNIDAZOLE  (FLAGYL ) 500 MG tablet Take 1 tablet (500 mg total) by mouth 2 (two) times daily. 14 tablet 0    Prenatal Vit-Fe Fumarate-FA (PRENATAL MULTIVITAMIN) TABS tablet Take 1 tablet by mouth daily at 12 noon.       I have reviewed patient's Past Medical Hx, Surgical Hx, Family Hx, Social Hx, medications and allergies.   ROS  Pertinent items noted in HPI and remainder of  comprehensive ROS otherwise negative.   PHYSICAL EXAM  Patient Vitals for the past 24 hrs:  BP Temp Temp src Pulse Resp SpO2 Height Weight  01/05/24 1718 (!) 111/58 98.7 F (37.1 C) Oral 86 16 100 % 6' (1.829 m) 120.4 kg    Constitutional: Well-developed, well-nourished female in no acute distress.  HEENT: atraumatic, normocephalic. Neck has normal ROM. EOM intact. Cardiovascular: normal rate & rhythm, warm and well-perfused Respiratory: normal effort, no problems with respiration noted GI: Abd soft, non-tender, non-distended MSK: Extremities nontender, no edema, normal ROM Skin: warm and dry. Acyanotic, no jaundice or pallor. Neurologic: Alert and oriented x 4. No abnormal coordination. Psychiatric: Normal mood. Speech not slurred, not rapid/pressured. Patient is cooperative. GU: no CVA tenderness  Labs: Results for orders placed or performed during the hospital encounter of 01/05/24 (from the past 24 hours)  Comprehensive metabolic panel     Status: Abnormal   Collection Time: 01/05/24  6:51 PM  Result Value Ref Range   Sodium 134 (L) 135 - 145 mmol/L   Potassium 3.7 3.5 - 5.1 mmol/L   Chloride 103 98 - 111 mmol/L   CO2 21 (L) 22 - 32 mmol/L   Glucose, Bld 92 70 - 99 mg/dL   BUN <5 (L) 6 - 20 mg/dL   Creatinine, Ser 9.45 0.44 -  1.00 mg/dL   Calcium 8.8 (L) 8.9 - 10.3 mg/dL   Total Protein 6.9 6.5 - 8.1 g/dL   Albumin 3.1 (L) 3.5 - 5.0 g/dL   AST 24 15 - 41 U/L   ALT 30 0 - 44 U/L   Alkaline Phosphatase 55 38 - 126 U/L   Total Bilirubin 0.4 0.0 - 1.2 mg/dL   GFR, Estimated >39 >39 mL/min   Anion gap 10 5 - 15  CBC     Status: Abnormal   Collection Time: 01/05/24  6:51 PM  Result Value Ref Range   WBC 10.2 4.0 - 10.5 K/uL   RBC 3.82 (L) 3.87 - 5.11 MIL/uL   Hemoglobin 11.3 (L) 12.0 - 15.0 g/dL   HCT 66.3 (L) 63.9 - 53.9 %   MCV 88.0 80.0 - 100.0 fL   MCH 29.6 26.0 - 34.0 pg   MCHC 33.6 30.0 - 36.0 g/dL   RDW 85.9 88.4 - 84.4 %   Platelets 285 150 - 400 K/uL   nRBC  0.0 0.0 - 0.2 %    Imaging:  No results found.  MDM & MAU COURSE  MDM: Moderate  MAU Course: -Vital signs within normal limits. -Patient reports BP has been >140 in the ED and at home several times, meeting diagnostic criteria for chronic hypertension. -Excedrin Tension for headache, declines Flexeril since she needs to drive. -CBC and CMP without significant abnormalities. -No red flag headache symptoms. Will refer for outpatient workup.  Differential diagnosis considered for headache includes but is not limited to: preeclampsia, tension headache, cluster, migraine  Orders Placed This Encounter  Procedures   Comprehensive metabolic panel   CBC   Ambulatory referral to Neurology   Discharge patient   Meds ordered this encounter  Medications   DISCONTD: cyclobenzaprine (FLEXERIL) tablet 10 mg   acetaminophen-caffeine (EXCEDRIN TENSION HEADACHE) 500-65 MG per tablet 2 tablet   cyclobenzaprine (FLEXERIL) 10 MG tablet    Sig: Take 1 tablet (10 mg total) by mouth 2 (two) times daily as needed for muscle spasms.    Dispense:  20 tablet    Refill:  1    ASSESSMENT   1. Headache in pregnancy, antepartum, second trimester   2. Chronic hypertension   3. [redacted] weeks gestation of pregnancy     PLAN  Discharge home in stable condition with return precautions.  Referral to Neurology made. Sent prescription for Flexeril to use prn with Excedrin Tension for acute headaches.     Allergies as of 01/05/2024   No Known Allergies      Medication List     STOP taking these medications    metroNIDAZOLE  500 MG tablet Commonly known as: FLAGYL        TAKE these medications    cyclobenzaprine 10 MG tablet Commonly known as: FLEXERIL Take 1 tablet (10 mg total) by mouth 2 (two) times daily as needed for muscle spasms.   Doxylamine -Pyridoxine  10-10 MG Tbec Commonly known as: Diclegis  Take 1 tablet by mouth 4 (four) times daily as needed ((2 tablets at night, one in the morning  if neede, can take one more at lunch if needed).   prenatal multivitamin Tabs tablet Take 1 tablet by mouth daily at 12 noon.        Joesph DELENA Sear, PA

## 2024-01-05 NOTE — Discharge Instructions (Signed)
 HEADACHE: If you get a headache at home, I recommend doing the following: 1) Drink 64 oz of water. Make sure you have something to eat. 2) Take 1000mg  tylenol (2 extra-strength pills). Alternatively, you can take Excedrin-Tension (NOT Excedrin migraine). Look for TENSION headache formulation. The main ingredients should be Acetaminophen (same as Tylenol) and Caffeine; should NOT have Aspirin in the formulation  3) If you need to you can also take Flexeril or Benadryl. This will likely make you very sleepy. Please take a nap as sleep can help headaches go away.

## 2024-02-03 DIAGNOSIS — Z113 Encounter for screening for infections with a predominantly sexual mode of transmission: Secondary | ICD-10-CM | POA: Diagnosis not present

## 2024-02-03 DIAGNOSIS — Z3481 Encounter for supervision of other normal pregnancy, first trimester: Secondary | ICD-10-CM | POA: Diagnosis not present

## 2024-02-03 DIAGNOSIS — O26892 Other specified pregnancy related conditions, second trimester: Secondary | ICD-10-CM | POA: Diagnosis not present

## 2024-02-03 DIAGNOSIS — Z131 Encounter for screening for diabetes mellitus: Secondary | ICD-10-CM | POA: Diagnosis not present

## 2024-02-03 DIAGNOSIS — Z3402 Encounter for supervision of normal first pregnancy, second trimester: Secondary | ICD-10-CM | POA: Diagnosis not present

## 2024-02-03 DIAGNOSIS — Z363 Encounter for antenatal screening for malformations: Secondary | ICD-10-CM | POA: Diagnosis not present

## 2024-02-03 DIAGNOSIS — Z1321 Encounter for screening for nutritional disorder: Secondary | ICD-10-CM | POA: Diagnosis not present

## 2024-02-03 DIAGNOSIS — R519 Headache, unspecified: Secondary | ICD-10-CM | POA: Diagnosis not present

## 2024-02-03 DIAGNOSIS — Z3A13 13 weeks gestation of pregnancy: Secondary | ICD-10-CM | POA: Diagnosis not present

## 2024-02-03 DIAGNOSIS — Z3490 Encounter for supervision of normal pregnancy, unspecified, unspecified trimester: Secondary | ICD-10-CM | POA: Diagnosis not present

## 2024-04-15 ENCOUNTER — Inpatient Hospital Stay (HOSPITAL_COMMUNITY)
Admission: AD | Admit: 2024-04-15 | Discharge: 2024-04-15 | Disposition: A | Discharge status: Home / Self Care | Source: Home / Self Care | Attending: Obstetrics and Gynecology | Admitting: Obstetrics and Gynecology

## 2024-04-15 ENCOUNTER — Other Ambulatory Visit: Payer: Self-pay

## 2024-04-15 ENCOUNTER — Encounter (HOSPITAL_COMMUNITY): Payer: Self-pay | Admitting: Obstetrics and Gynecology

## 2024-04-15 ENCOUNTER — Inpatient Hospital Stay (HOSPITAL_COMMUNITY)

## 2024-04-15 DIAGNOSIS — Z3A31 31 weeks gestation of pregnancy: Secondary | ICD-10-CM

## 2024-04-15 DIAGNOSIS — Z3689 Encounter for other specified antenatal screening: Secondary | ICD-10-CM

## 2024-04-15 DIAGNOSIS — Z3493 Encounter for supervision of normal pregnancy, unspecified, third trimester: Secondary | ICD-10-CM

## 2024-04-15 DIAGNOSIS — O26643 Intrahepatic cholestasis of pregnancy, third trimester: Secondary | ICD-10-CM

## 2024-04-15 HISTORY — DX: Other specified health status: Z78.9

## 2024-04-15 LAB — COMPREHENSIVE METABOLIC PANEL WITH GFR
ALT: 108 U/L — ABNORMAL HIGH (ref 0–44)
AST: 62 U/L — ABNORMAL HIGH (ref 15–41)
Albumin: 3.5 g/dL (ref 3.5–5.0)
Alkaline Phosphatase: 202 U/L — ABNORMAL HIGH (ref 38–126)
Anion gap: 14 (ref 5–15)
BUN: <5 mg/dL — ABNORMAL LOW (ref 6–20)
CO2: 17 mmol/L — ABNORMAL LOW (ref 22–32)
Calcium: 9.2 mg/dL (ref 8.9–10.3)
Chloride: 103 mmol/L (ref 98–111)
Creatinine, Ser: 0.45 mg/dL (ref 0.44–1.00)
GFR, Estimated: >60 mL/min
Glucose, Bld: 110 mg/dL — ABNORMAL HIGH (ref 70–99)
Potassium: 3.8 mmol/L (ref 3.5–5.1)
Sodium: 135 mmol/L (ref 135–145)
Total Bilirubin: 0.3 mg/dL (ref 0.0–1.2)
Total Protein: 7.6 g/dL (ref 6.5–8.1)

## 2024-04-15 MED ORDER — URSODIOL 300 MG PO CAPS: 300.0000 mg | ORAL_CAPSULE | Freq: Three times a day (TID) | ORAL | 4 refills | Status: AC

## 2024-04-15 NOTE — Discharge Instructions (Signed)
 Your baby is doing well based on the monitoring and ultrasound that we did today.  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 This will help to lower the bile acid levels in your body to protect you and protect your baby. Generally, it is recommended to deliver at around 37 weeks of pregnancy, so you will work with your OB to plan for your delivery. Let your OB know if your symptoms do not get any better or get worse even after starting the medication.  Reasons to return to MAU at [facility]: Your blood pressure is >160/110. You have a headache that will not go away after having something to eat, something to drink, and Tylenol . You have changes in your vision or upper abdominal pain that does not get better with Tylenol . Less than 36 weeks: Contractions feels like menstrual cramps. You should go to the hospital if you have more than 6 contractions in an hour, even after you have rested and drank at least 16 ounces of water.  More than 36 weeks: You begin to have strong, frequent contractions 5 minutes apart or less, each last 1 minute, these have been going on for 1-2 hours, and you cannot walk or talk during them. Your water breaks.  Sometimes it is a big gush of fluid. However, many times it may it may be much more subtle. You should go to the hospital if you have a constant leakage of fluid from your vagina, enough to soak a pad when you are walking around.  You have vaginal bleeding.  It is normal to have a small amount of spotting if your cervix was checked. If you have bleeding requiring the use of a pad, go to the hospital. You don't feel your baby moving like normal.  If you think that you babys movement is decreased, eat a snack and rest on your left side in a quiet room for one hour. If you have not felt the baby move more than 6 times in an hour GO TO THE HOSPITAL.      What is a  fetal movement count?  A fetal movement count  is the number of times that you feel your baby move during a certain amount of time. This may also be called a fetal kick count by some people, but fetal movement count is a more accurate term. Movements can be kicks, flutters, swishes, rolls, or jabs. A fetal movement count is recommended for every pregnant woman. You may be asked to start counting fetal movements as early as week 28 of your pregnancy. Pay attention to when your baby is most active. Your baby has times of activity and times to sleep just like you do! You may notice your baby's sleep and wake cycles. You may also notice things that make your baby move more. You should do a fetal movement count: When your baby is normally most active. At the same time each day. A good time to count movements is while you are resting, after having something to eat and drink.  How do I count fetal movements? Find a quiet, comfortable area. Sit, or lie down on your side. Write down the date, the start time and stop time, and the number of movements that you felt between those two times. Take this information with you to your health care visits. Write down your start time when you feel the first movement. Count kicks,  flutters, swishes, rolls, and jabs. You should feel at least 10 movements. You may stop counting after you have felt 10 movements, or if you have been counting for 2 hours. Write down the stop time. If you do not feel 10 movements in 2 hours, contact your health care provider for further instructions. Your health care provider may want to do additional tests to assess your baby's well-being. Contact a health care provider if: You feel fewer than 10 movements in 2 hours. Your baby is not moving like he or she usually does.

## 2024-04-15 NOTE — MAU Provider Note (Cosign Needed Addendum)
 Chief Complaint:  ICP   HPI   None     Dawn Bell is a 19 y.o. G1P0 at [redacted]w[redacted]d who presents to maternity admissions reporting she was instructed by her OB to be seen in the MAU due to abnormal blood work concerning for ICP.  [facility] on-call provider Dr. Henry clarified that patient has been complaining of intense itching for the past month in the absence of visible rash.  This morning, she also noted decreased fetal movement.  Due to decreased fetal movement and new results of bile acids at 31, [facility] recommended reporting to MAU for fetal monitoring and assessment. Patient endorses itching of the palms of her hands and soles of her feet became very intense 3 weeks ago. Her OB told her to try Benadryl, she did not get any relief. She notes that she has felt less fetal movement since the itching became intense. She has not had anything to eat today.  Pregnancy Course: Receives care at [facility]. Prenatal records reviewed. Dr. Henry reports bile acids elevated at 31.  Past Medical History:  Diagnosis Date   Medical history non-contributory    OB History  Gravida Para Term Preterm AB Living  1       SAB IAB Ectopic Multiple Live Births          # Outcome Date GA Lbr Len/2nd Weight Sex Type Anes PTL Lv  1 Current            Past Surgical History:  Procedure Laterality Date   NO PAST SURGERIES     History reviewed. No pertinent family history. Social History[1] Allergies[2] Medications Prior to Admission  Medication Sig Dispense Refill Last Dose/Taking   cholecalciferol  (VITAMIN D3) 25 MCG (1000 UNIT) tablet Take 1,000 Units by mouth daily.   04/14/2024   cyclobenzaprine  (FLEXERIL ) 10 MG tablet Take 1 tablet (10 mg total) by mouth 2 (two) times daily as needed for muscle spasms. 20 tablet 1 Past Month   Doxylamine -Pyridoxine  (DICLEGIS ) 10-10 MG TBEC Take 1 tablet by mouth 4 (four) times daily as needed ((2 tablets at night, one in the morning if neede, can take one more at  lunch if needed). 60 tablet 2 Past Week   ferrous sulfate 324 MG TBEC Take 324 mg by mouth.   04/14/2024   Prenatal Vit-Fe Fumarate-FA (PRENATAL MULTIVITAMIN) TABS tablet Take 1 tablet by mouth daily at 12 noon.   04/14/2024   I have reviewed patient's Past Medical Hx, Surgical Hx, Family Hx, Social Hx, medications and allergies.   ROS  Pertinent items noted in HPI and remainder of comprehensive ROS otherwise negative.   PHYSICAL EXAM  Patient Vitals for the past 24 hrs:  BP Temp Temp src Pulse Resp SpO2 Height Weight  04/15/24 1228 127/70 -- -- 90 -- -- -- --  04/15/24 1208 127/68 98.2 F (36.8 C) Oral 96 18 99 % -- --  04/15/24 1203 -- -- -- -- -- -- 6' (1.829 m) 119 kg    Constitutional: Well-developed, well-nourished female in no acute distress.  HEENT: atraumatic, normocephalic. Neck has normal ROM. EOM intact. Cardiovascular: normal rate & rhythm, warm and well-perfused Respiratory: normal effort, no problems with respiration noted GI: Abd soft, non-tender, non-distended MSK: Extremities nontender, no edema, normal ROM Skin: warm and dry. Acyanotic, no jaundice or pallor. No rash. Neurologic: Alert and oriented x 4. No abnormal coordination. Psychiatric: Normal mood. Speech not slurred, not rapid/pressured. Patient is cooperative. GU: no CVA tenderness  Fetal Tracing: Baseline FHR: 145 per minute initially, changed to 135 per minute after 1330 once back from US  Fetal heart variability: moderate Fetal Heart Rate accelerations: yes  Fetal Heart Rate decelerations: a couple of variable decelerations before going to ultrasound, none after getting back from US  Fetal Non-stress Test: Category I (reactive) Toco: no uterine contractions   Labs: Results for orders placed or performed during the hospital encounter of 04/15/24 (from the past 24 hours)  Comprehensive metabolic panel with GFR     Status: Abnormal   Collection Time: 04/15/24 12:48 PM  Result Value Ref Range   Sodium  135 135 - 145 mmol/L   Potassium 3.8 3.5 - 5.1 mmol/L   Chloride 103 98 - 111 mmol/L   CO2 17 (L) 22 - 32 mmol/L   Glucose, Bld 110 (H) 70 - 99 mg/dL   BUN <5 (L) 6 - 20 mg/dL   Creatinine, Ser 9.54 0.44 - 1.00 mg/dL   Calcium 9.2 8.9 - 89.6 mg/dL   Total Protein 7.6 6.5 - 8.1 g/dL   Albumin 3.5 3.5 - 5.0 g/dL   AST 62 (H) 15 - 41 U/L   ALT 108 (H) 0 - 44 U/L   Alkaline Phosphatase 202 (H) 38 - 126 U/L   Total Bilirubin 0.3 0.0 - 1.2 mg/dL   GFR, Estimated >39 >39 mL/min   Anion gap 14 5 - 15    Imaging:  US  MFM FETAL BPP WO NON STRESS Result Date: 04/15/2024 ----------------------------------------------------------------------  OBSTETRICS REPORT                       (Signed Final 04/15/2024 02:16 pm) ---------------------------------------------------------------------- Patient Info  ID #:       [id number]                          D.O.B.:  [birth year] (18 yrs)(F)  Name:       Dawn Bell                 Visit Date: 04/15/2024 12:54 pm ---------------------------------------------------------------------- Performed By  Attending:        Fredia Fresh MD        Ref. Address:     [address]                                                             [address]  Performed By:     Jonette Nap        [facility]  Referred By:      [facility] ---------------------------------------------------------------------- Orders  #  Description                           Code  Ordered By  1  US  MFM FETAL BPP WO NON               76819.01    Dawn Bell     STRESS ----------------------------------------------------------------------  #  Order #                     Accession #                Episode #  1  [id number]                   [id number]                 [id number] ---------------------------------------------------------------------- Indications  Decreased fetal movements,  third trimester,    O36.8130  unspecified  Cholestasis of pregnancy, third trimester      N73.386X16.8  Obesity complicating pregnancy, third          O99.213  trimester  [redacted] weeks gestation of pregnancy                Z3A.31 ---------------------------------------------------------------------- Fetal Evaluation  Num Of Fetuses:         1  Fetal Heart Rate(bpm):  150  Cardiac Activity:       Observed  Presentation:           Cephalic  Placenta:               Anterior  P. Cord Insertion:      Visualized  Amniotic Fluid  AFI FV:      Within normal limits  AFI Sum(cm)     %Tile       Largest Pocket(cm)  15.1            53          5.3  RUQ(cm)       RLQ(cm)       LUQ(cm)        LLQ(cm)  4.8           2.9           5.3            2.1 ---------------------------------------------------------------------- Biophysical Evaluation  Amniotic F.V:   Pocket => 2 cm             F. Tone:        Observed  F. Movement:    Observed                   Score:          8/8  F. Breathing:   Observed ---------------------------------------------------------------------- Biometry  LV:        3.3  mm ---------------------------------------------------------------------- OB History  Gravidity:    1 ---------------------------------------------------------------------- Gestational Age  LMP:           31w 3d        Date:  09/08/23                   EDD:   06/14/24  Best:          Dawn Bell 3d     Det. By:  LMP  (09/08/23)          EDD:   06/14/24 ---------------------------------------------------------------------- Anatomy  Diaphragm:             Appears normal         Kidneys:                Appear  normal  Stomach:               Appears normal, left   Bladder:                Appears normal                         sided ---------------------------------------------------------------------- Comments  > ---------------------------------------------------------------------- Impression  Patient presented to the MAU with decreased fetal  movements.  Normal  amniotic fluid.  Cephalic presentation.  Antenatal  testing is reassuring.  BPP 8/8. ----------------------------------------------------------------------                 Fredia Fresh, MD Electronically Signed Final Report   04/15/2024 02:16 pm ----------------------------------------------------------------------    MDM & MAU COURSE  MDM: High  MAU Course: -Vital signs within normal limits.  -CMP for liver function. -BPP given 3 weeks of decreased fetal movement. -BPP 8/8. -NST reactive. -Elevated AST and ALT also consistent with ICP. -Discussed with Dr. Jayne, discharge on ursodiol  300 mg TID and follow up with OB. -Patient endorses good fetal movement. -Evaluation does not show pathology that would require ongoing emergent intervention or inpatient treatment. Patient is hemodynamically stable and mentating appropriately. Discussed findings and plan with patient, who agrees with care plan.   Differential diagnosis considered for decreased fetal movement includes but is not limited to: fetal sleep, poor maternal perception of movement, medications, early gestational age, decreased/increased amniotic fluid volume, maternal position (sitting or standing versus lying), fetal position (anterior position of the fetal spine), anterior placenta, maternal physical activity   Orders Placed This Encounter  Procedures   US  MFM FETAL BPP WO NON STRESS   Comprehensive metabolic panel with GFR   Discharge patient   Meds ordered this encounter  Medications   ursodiol  (ACTIGALL ) 300 MG capsule    Sig: Take 1 capsule (300 mg total) by mouth 3 (three) times daily.    Dispense:  90 capsule    Refill:  4    ASSESSMENT   1. Intrahepatic cholestasis of pregnancy in third trimester   2. NST (non-stress test) reactive   3. Movement of fetus present during pregnancy in third trimester   4. [redacted] weeks gestation of pregnancy     PLAN  Discharge home in stable condition with return precautions.  Start  ursodiol  300 mg TID for ICP.  Follow up with [facility], next appointment scheduled for 04/20/2024.    Allergies as of 04/15/2024   No Known Allergies      Medication List     TAKE these medications    cholecalciferol  25 MCG (1000 UNIT) tablet Commonly known as: VITAMIN D3 Take 1,000 Units by mouth daily.   cyclobenzaprine  10 MG tablet Commonly known as: FLEXERIL  Take 1 tablet (10 mg total) by mouth 2 (two) times daily as needed for muscle spasms.   Doxylamine -Pyridoxine  10-10 MG Tbec Commonly known as: Diclegis  Take 1 tablet by mouth 4 (four) times daily as needed ((2 tablets at night, one in the morning if neede, can take one more at lunch if needed).   ferrous sulfate 324 MG Tbec Take 324 mg by mouth.   prenatal multivitamin Tabs tablet Take 1 tablet by mouth daily at 12 noon.   ursodiol  300 MG capsule Commonly known as: ACTIGALL  Take 1 capsule (300 mg total) by mouth 3 (three) times daily.        Joesph DELENA Sear, PA      [1]  Social  History Tobacco Use   Smoking status: Never   Smokeless tobacco: Never  Vaping Use   Vaping status: Never Used  Substance Use Topics   Alcohol use: Never   Drug use: Never  [2] No Known Allergies

## 2024-04-15 NOTE — MAU Note (Signed)
 Dawn Bell is a 19 y.o. at [redacted]w[redacted]d here in MAU via EMS reporting: she was instructed by her OB to be seen for abnormal blood work and evaluation for ICP.  Denies VB and LOF.  Reports +FM.  LMP: 09/08/2023 Onset of complaint: today Pain score: 0 Vitals:   04/15/24 1208  BP: 127/68  Pulse: 96  Resp: 18  Temp: 98.2 F (36.8 C)  SpO2: 99%     FHT: 153 bpm  Lab orders placed from triage: UA

## 2024-05-16 ENCOUNTER — Ambulatory Visit: Admitting: Neurology
